# Patient Record
Sex: Male | Born: 1970 | Race: White | Hispanic: No | Marital: Married | Smoking: Never smoker
Health system: Southern US, Community
[De-identification: ages and names within clinical notes are randomized; demographics above are authoritative.]

## PROBLEM LIST (undated history)

## (undated) DIAGNOSIS — J45909 Unspecified asthma, uncomplicated: Secondary | ICD-10-CM

## (undated) DIAGNOSIS — K859 Acute pancreatitis without necrosis or infection, unspecified: Secondary | ICD-10-CM

---

## 2004-12-14 ENCOUNTER — Inpatient Hospital Stay: Payer: Self-pay | Admitting: Internal Medicine

## 2006-04-29 ENCOUNTER — Emergency Department: Payer: Self-pay | Admitting: Emergency Medicine

## 2006-06-01 ENCOUNTER — Emergency Department: Payer: Self-pay | Admitting: Unknown Physician Specialty

## 2006-06-19 ENCOUNTER — Emergency Department: Payer: Self-pay | Admitting: Emergency Medicine

## 2008-03-11 ENCOUNTER — Emergency Department: Payer: Self-pay | Admitting: Emergency Medicine

## 2008-03-22 ENCOUNTER — Ambulatory Visit: Payer: Self-pay | Admitting: Unknown Physician Specialty

## 2008-03-30 ENCOUNTER — Ambulatory Visit: Payer: Self-pay | Admitting: Orthopedic Surgery

## 2008-07-18 ENCOUNTER — Ambulatory Visit: Payer: Self-pay | Admitting: Orthopedic Surgery

## 2010-09-05 ENCOUNTER — Emergency Department: Payer: Self-pay | Admitting: Unknown Physician Specialty

## 2011-11-09 ENCOUNTER — Ambulatory Visit: Payer: Self-pay | Admitting: Physician Assistant

## 2013-01-17 ENCOUNTER — Ambulatory Visit: Payer: Self-pay | Admitting: Physician Assistant

## 2014-03-07 ENCOUNTER — Ambulatory Visit: Payer: Self-pay | Admitting: Family Medicine

## 2014-03-07 ENCOUNTER — Ambulatory Visit: Payer: Self-pay | Admitting: Orthopedic Surgery

## 2014-03-07 LAB — COMPREHENSIVE METABOLIC PANEL
ALBUMIN: 5.1 g/dL — AB (ref 3.4–5.0)
ALT: 89 U/L — AB (ref 14–63)
Alkaline Phosphatase: 80 U/L (ref 46–116)
Anion Gap: 20 — ABNORMAL HIGH (ref 7–16)
BILIRUBIN TOTAL: 1 mg/dL (ref 0.2–1.0)
BUN: 14 mg/dL (ref 7–18)
CHLORIDE: 101 mmol/L (ref 98–107)
CREATININE: 1.22 mg/dL (ref 0.60–1.30)
Calcium, Total: 10 mg/dL (ref 8.5–10.1)
Co2: 20 mmol/L — ABNORMAL LOW (ref 21–32)
EGFR (African American): >60
EGFR (Non-African Amer.): >60
GLUCOSE: 165 mg/dL — AB (ref 65–99)
Osmolality: 285 (ref 275–301)
POTASSIUM: 4.2 mmol/L (ref 3.5–5.1)
SGOT(AST): 100 U/L — ABNORMAL HIGH (ref 15–37)
Sodium: 141 mmol/L (ref 136–145)
TOTAL PROTEIN: 8.7 g/dL — AB (ref 6.4–8.2)

## 2014-03-07 LAB — CBC WITH DIFFERENTIAL/PLATELET
BASOS ABS: 0.2 10*3/uL — AB (ref 0.0–0.1)
Basophil %: 1.5 %
EOS ABS: 0 10*3/uL (ref 0.0–0.7)
Eosinophil %: 0 %
HCT: 49.1 % (ref 40.0–52.0)
HGB: 16.7 g/dL (ref 13.0–18.0)
LYMPHS ABS: 1.1 10*3/uL (ref 1.0–3.6)
Lymphocyte %: 7.5 %
MCH: 31.8 pg (ref 26.0–34.0)
MCHC: 34.1 g/dL (ref 32.0–36.0)
MCV: 93 fL (ref 80–100)
MONO ABS: 0.8 x10 3/mm (ref 0.2–1.0)
Monocyte %: 5.4 %
NEUTROS PCT: 85.6 %
Neutrophil #: 12.4 10*3/uL — ABNORMAL HIGH (ref 1.4–6.5)
Platelet: 301 10*3/uL (ref 150–440)
RBC: 5.26 10*6/uL (ref 4.40–5.90)
RDW: 13.6 % (ref 11.5–14.5)
WBC: 14.4 10*3/uL — ABNORMAL HIGH (ref 3.8–10.6)

## 2014-03-07 LAB — LIPASE, BLOOD: LIPASE: 106 U/L (ref 73–393)

## 2015-10-19 ENCOUNTER — Ambulatory Visit (INDEPENDENT_AMBULATORY_CARE_PROVIDER_SITE_OTHER)
Admission: EM | Admit: 2015-10-19 | Discharge: 2015-10-19 | Disposition: A | Discharge status: Home / Self Care | Payer: Managed Care, Other (non HMO) | Source: Home / Self Care | Attending: Family Medicine | Admitting: Family Medicine

## 2015-10-19 ENCOUNTER — Encounter: Payer: Self-pay | Admitting: Emergency Medicine

## 2015-10-19 ENCOUNTER — Emergency Department
Admission: EM | Admit: 2015-10-19 | Discharge: 2015-10-19 | Disposition: A | Discharge status: Home / Self Care | Payer: Managed Care, Other (non HMO) | Attending: Emergency Medicine | Admitting: Emergency Medicine

## 2015-10-19 DIAGNOSIS — R1084 Generalized abdominal pain: Secondary | ICD-10-CM

## 2015-10-19 DIAGNOSIS — R112 Nausea with vomiting, unspecified: Secondary | ICD-10-CM

## 2015-10-19 DIAGNOSIS — F10930 Alcohol use, unspecified with withdrawal, uncomplicated: Secondary | ICD-10-CM

## 2015-10-19 DIAGNOSIS — F10230 Alcohol dependence with withdrawal, uncomplicated: Secondary | ICD-10-CM

## 2015-10-19 LAB — COMPREHENSIVE METABOLIC PANEL
ALBUMIN: 5.2 g/dL — AB (ref 3.5–5.0)
ALK PHOS: 61 U/L (ref 38–126)
ALT: 27 U/L (ref 17–63)
ALT: 27 U/L (ref 17–63)
AST: 31 U/L (ref 15–41)
AST: 35 U/L (ref 15–41)
Albumin: 4.9 g/dL (ref 3.5–5.0)
Alkaline Phosphatase: 68 U/L (ref 38–126)
Anion gap: 13 (ref 5–15)
Anion gap: 12 (ref 5–15)
BILIRUBIN TOTAL: 1.7 mg/dL — AB (ref 0.3–1.2)
BUN: 13 mg/dL (ref 6–20)
BUN: 13 mg/dL (ref 6–20)
CALCIUM: 9.7 mg/dL (ref 8.9–10.3)
CHLORIDE: 96 mmol/L — AB (ref 101–111)
CO2: 24 mmol/L (ref 22–32)
CO2: 27 mmol/L (ref 22–32)
CREATININE: 1.03 mg/dL (ref 0.61–1.24)
Calcium: 9.9 mg/dL (ref 8.9–10.3)
Chloride: 97 mmol/L — ABNORMAL LOW (ref 101–111)
Creatinine, Ser: 0.96 mg/dL (ref 0.61–1.24)
GFR calc Af Amer: >60 mL/min (ref >60)
GFR calc Af Amer: >60 mL/min (ref >60)
GFR calc non Af Amer: >60 mL/min (ref >60)
GLUCOSE: 118 mg/dL — AB (ref 65–99)
Glucose, Bld: 112 mg/dL — ABNORMAL HIGH (ref 65–99)
POTASSIUM: 3.6 mmol/L (ref 3.5–5.1)
POTASSIUM: 3.6 mmol/L (ref 3.5–5.1)
SODIUM: 133 mmol/L — AB (ref 135–145)
Sodium: 136 mmol/L (ref 135–145)
TOTAL PROTEIN: 8.4 g/dL — AB (ref 6.5–8.1)
Total Bilirubin: 2 mg/dL — ABNORMAL HIGH (ref 0.3–1.2)
Total Protein: 9 g/dL — ABNORMAL HIGH (ref 6.5–8.1)

## 2015-10-19 LAB — CBC WITH DIFFERENTIAL/PLATELET
BASOS ABS: 0 10*3/uL (ref 0–0.1)
BASOS PCT: 0 %
EOS PCT: 0 %
Eosinophils Absolute: 0 10*3/uL (ref 0–0.7)
HCT: 49.2 % (ref 40.0–52.0)
Hemoglobin: 16.9 g/dL (ref 13.0–18.0)
Lymphocytes Relative: 13 %
Lymphs Abs: 1 10*3/uL (ref 1.0–3.6)
MCH: 31.8 pg (ref 26.0–34.0)
MCHC: 34.4 g/dL (ref 32.0–36.0)
MCV: 92.2 fL (ref 80.0–100.0)
MONO ABS: 0.7 10*3/uL (ref 0.2–1.0)
Monocytes Relative: 10 %
NEUTROS ABS: 5.6 10*3/uL (ref 1.4–6.5)
Neutrophils Relative %: 77 %
PLATELETS: 301 10*3/uL (ref 150–440)
RBC: 5.34 MIL/uL (ref 4.40–5.90)
RDW: 14.2 % (ref 11.5–14.5)
WBC: 7.3 10*3/uL (ref 3.8–10.6)

## 2015-10-19 LAB — CBC
HEMATOCRIT: 49.1 % (ref 40.0–52.0)
Hemoglobin: 16.9 g/dL (ref 13.0–18.0)
MCH: 32.1 pg (ref 26.0–34.0)
MCHC: 34.4 g/dL (ref 32.0–36.0)
MCV: 93.3 fL (ref 80.0–100.0)
Platelets: 281 10*3/uL (ref 150–440)
RBC: 5.26 MIL/uL (ref 4.40–5.90)
RDW: 14.2 % (ref 11.5–14.5)
WBC: 7.1 10*3/uL (ref 3.8–10.6)

## 2015-10-19 LAB — LIPASE, BLOOD
LIPASE: 38 U/L (ref 11–51)
Lipase: 26 U/L (ref 11–51)

## 2015-10-19 LAB — ETHANOL: Alcohol, Ethyl (B): <5 mg/dL (ref <5)

## 2015-10-19 MED ORDER — SODIUM CHLORIDE 0.9 % IV SOLN
1000.0000 mL | Freq: Once | INTRAVENOUS | Status: AC
  Administered 2015-10-19: 1000 mL via INTRAVENOUS

## 2015-10-19 MED ORDER — ONDANSETRON HCL 4 MG/2ML IJ SOLN
4.0000 mg | Freq: Once | INTRAMUSCULAR | Status: AC
  Administered 2015-10-19: 4 mg via INTRAVENOUS
  Filled 2015-10-19: qty 2

## 2015-10-19 MED ORDER — LORAZEPAM 2 MG/ML IJ SOLN
1.0000 mg | Freq: Once | INTRAMUSCULAR | Status: AC
  Administered 2015-10-19: 1 mg via INTRAVENOUS
  Filled 2015-10-19: qty 1

## 2015-10-19 MED ORDER — LORAZEPAM 0.5 MG PO TABS: 0.5000 mg | ORAL_TABLET | Freq: Three times a day (TID) | ORAL | 0 refills | Status: AC | PRN

## 2015-10-19 MED ORDER — ONDANSETRON 8 MG PO TBDP
8.0000 mg | ORAL_TABLET | Freq: Once | ORAL | Status: AC
  Administered 2015-10-19: 8 mg via ORAL

## 2015-10-19 MED ORDER — ONDANSETRON HCL 4 MG PO TABS: 4.0000 mg | ORAL_TABLET | Freq: Every day | ORAL | 1 refills | Status: AC | PRN

## 2015-10-19 NOTE — ED Provider Notes (Signed)
[facility]    CSN: [id number] Arrival date & time: 10/19/15  0806  First Provider Contact:  None       History   Chief Complaint Chief Complaint  Patient presents with  . Abdominal Pain    HPI Edgar Hatfield is a 45 y.o. male.   The history is provided by the patient.  Patient is in the process of detoxing from alcohol. He says that he has had 8-10 shots of liquor a day and he has decided to stop. He is trying to find and outpatient detox program, he had his last liquor drink on Wednesday and since yesterday he hasn't been able to keep anything down. Whenever he drinks water he just throws it back up. He has the shakes and hot/cold sweats.  (as above history by RN confirmed with patient)  Patient also states he has a prior history of admission for alcohol rehabilitation and also prior history of pancreatitis about 3 years ago. Denies any chest pains, shortness of breath, hematemesis, melena, hematochezia.    History reviewed. No pertinent past medical history.  There are no active problems to display for this patient.   History reviewed. No pertinent surgical history.     Home Medications    Prior to Admission medications   Medication Sig Start Date End Date Taking? Authorizing Provider  losartan (COZAAR) 25 MG tablet Take 25 mg by mouth daily.   Yes Historical Provider, MD  Omeprazole (PRILOSEC PO) Take by mouth.   Yes Historical Provider, MD  sertraline (ZOLOFT) 25 MG tablet Take 25 mg by mouth daily.   Yes Historical Provider, MD    Family History History reviewed. No pertinent family history.  Social History Social History  Substance Use Topics  . Smoking status: Never Smoker  . Smokeless tobacco: Never Used  . Alcohol use Yes     Allergies   Review of patient's allergies indicates no known allergies.   Review of Systems Review of Systems   Physical Exam Triage Vital Signs ED Triage Vitals  Enc Vitals Group     BP 10/19/15  0831 (!) 158/84     Pulse Rate 10/19/15 0831 76     Resp 10/19/15 0831 18     Temp 10/19/15 0831 98.2 F (36.8 C)     Temp Source 10/19/15 0831 Oral     SpO2 10/19/15 0831 97 %     Weight 10/19/15 0827 165 lb (74.8 kg)     Height 10/19/15 0827 5\' 9"  (1.753 m)     Head Circumference --      Peak Flow --      Pain Score 10/19/15 0829 5     Pain Loc --      Pain Edu? --      Excl. in GC? --    No data found.   Updated Vital Signs BP (!) 158/84 (BP Location: Left Arm)   Pulse 76   Temp 98.2 F (36.8 C) (Oral)   Resp 18   Ht 5\' 9"  (1.753 m)   Wt 165 lb (74.8 kg)   SpO2 97%   BMI 24.37 kg/m   Visual Acuity Right Eye Distance:   Left Eye Distance:   Bilateral Distance:    Right Eye Near:   Left Eye Near:    Bilateral Near:     Physical Exam  Constitutional: He appears well-developed and well-nourished. No distress.  HENT:  Head: Normocephalic and atraumatic.  Eyes: No scleral icterus.  Neck:  Neck supple.  Cardiovascular: Normal rate, regular rhythm, normal heart sounds and intact distal pulses.   Pulmonary/Chest: Effort normal and breath sounds normal. No respiratory distress. He has no wheezes. He has no rales.  Abdominal: Soft. Bowel sounds are normal. He exhibits no distension and no mass. There is tenderness (mild-moderate diffuse tenderness to palpation; no rebound or guarding). There is no rebound and no guarding. No hernia.  Skin: He is not diaphoretic.  Nursing note and vitals reviewed.    UC Treatments / Results  Labs (all labs ordered are listed, but only abnormal results are displayed) Labs Reviewed  COMPREHENSIVE METABOLIC PANEL - Abnormal; Notable for the following:       Result Value   Sodium 133 (*)    Chloride 96 (*)    Glucose, Bld 118 (*)    Total Protein 9.0 (*)    Albumin 5.2 (*)    Total Bilirubin 2.0 (*)    All other components within normal limits  CBC WITH DIFFERENTIAL/PLATELET  LIPASE, BLOOD    EKG  EKG Interpretation None         Radiology No results found.  Procedures Procedures (including critical care time)  Medications Ordered in UC Medications  ondansetron (ZOFRAN-ODT) disintegrating tablet 8 mg (8 mg Oral Given 10/19/15 0858)     Initial Impression / Assessment and Plan / UC Course  I have reviewed the triage vital signs and the nursing notes.  Pertinent labs & imaging results that were available during my care of the patient were reviewed by me and considered in my medical decision making (see chart for details).  Clinical Course      Final Clinical Impressions(s) / UC Diagnoses   Final diagnoses:  Generalized abdominal pain  Alcohol withdrawal, uncomplicated (HCC)  Nausea and vomiting, vomiting of unspecified type    New Prescriptions New Prescriptions   No medications on file    1. diagnosis and risks/potential complications reviewed with patient; due to patient's history and current symptoms recommend patient go to ED for further evaluation and management. Patient given zofran 8mg  odt and labs drawn.  Patient verbalizes understanding and refuses transport by EMS. States his father will drive him to the ED.    Payton Mccallumrlando Jarmar Rousseau, MD 10/19/15 [phone]

## 2015-10-19 NOTE — BH Assessment (Signed)
Assessment Note  Edgar Hatfield is an 45 y.o. male who presents to the ER after being seen at [facility] because he wasn't feeling well. Patient is currently detoxing form alcohol. He had his last drink this past Wednesday (10/17/2015). Prior to that he was drinking approximately 8 shots of liquor a day. Current withdrawal symptoms are; shakes, some nausea and vomiting. He admits to past cannabis use as well. Last time was approximately two months ago.  When he was 19 years, he attempted to overdose on medications. During that time he dropped out of college and his roommate introduced him to cocaine.  This took place when [place] living in [place]. Other inpatient treatment was in 2013. It was for substance abuse treatment.  Patient is denying SI/HI and AV/H. He denies current involvement with the legal system. He states he have his wife and family for support.  Discussed patient with ER MD (Dr. Cyril LoosenKinner) and patient is able to discharge home when medically cleared. Patient was giving referral information and instructions on how to follow up with Outpatient Treatment [facility] and [facility]. He stated his PCP had referred him to a therapist but haven't set it up yet.  Writer also advised the patient to call the toll free phone on his insurance card for the counselors and agencies in his network.  [facility] ? [address],  [place], [place] [address] Phone: [phone]  Diagnosis: Alcohol Use Disorder, Severe  Past Medical History: History reviewed. No pertinent past medical history.  History reviewed. No pertinent surgical history.  Family History: No family history on file.  Social History:  reports that he has never smoked. He has never used smokeless tobacco. He reports that he drinks alcohol. He reports that he does not use drugs.  Additional Social History:  Alcohol / Drug Use Pain Medications: See  PTA Prescriptions: See PTA Over the Counter: See PTA History of alcohol / drug use?: Yes Longest period of sobriety (when/how long): "A year" Negative Consequences of Use: Legal, Personal relationships, Financial Withdrawal Symptoms: Cramps, Sweats, Fever / Chills, Nausea / Vomiting Substance #1 Name of Substance 1: Alcohol 1 - Age of First Use: 13 1 - Amount (size/oz): "8 to 10 shots of 100 proof" 1 - Frequency: Daily 1 - Duration: "Skipping a day or two? About two to three years." 1 - Last Use / Amount: 10/17/2015  CIWA: CIWA-Ar BP: (!) 177/110 Pulse Rate: 68 Nausea and Vomiting: mild nausea with no vomiting Tactile Disturbances: none Tremor: moderate, with patient's arms extended Auditory Disturbances: not present Paroxysmal Sweats: no sweat visible Visual Disturbances: not present Anxiety: two Headache, Fullness in Head: very mild Agitation: normal activity Orientation and Clouding of Sensorium: oriented and can do serial additions CIWA-Ar Total: 8 COWS:    Allergies: No Known Allergies  Home Medications:  (Not in a hospital admission)  OB/GYN Status:  No LMP for male patient.  General Assessment Data Location of Assessment: [facility] ED TTS Assessment: In system Is this a Tele or Face-to-Face Assessment?: Face-to-Face Is this an Initial Assessment or a Re-assessment for this encounter?: Initial Assessment Marital status: [place]ried [place] n[place] Is patient pregnant?: No Pregnancy Status: No Living Arrangements: Spouse/significant other, Children Can pt return to current living arrangement?: Yes Admission Status: Voluntary Is patient capable of signing voluntary admission?: Yes Referral Source: Self/Family/Friend Insurance type: Designer, industrial[employer]  Medical Screening Exam [facility] Walk-in ONLY) Medical Exam completed:  Yes  Crisis Care Plan Living Arrangements: Spouse/significant other, Children Legal Guardian: Other: (Reports of none) Name of Psychiatrist: Reports of  None Name of Therapist: Reports of None  Education Status Is patient currently in school?: No Current Grade: n/a Highest grade of school patient has completed: Some college Name of school: n/a Contact person: n/a  Risk to self with the past 6 months Suicidal Ideation: No Has patient been a risk to self within the past 6 months prior to admission? : No Suicidal Intent: No Has patient had any suicidal intent within the past 6 months prior to admission? : No Is patient at risk for suicide?: No Suicidal Plan?: No Has patient had any suicidal plan within the past 6 months prior to admission? : No Access to Means: No What has been your use of drugs/alcohol within the last 12 months?: Alcohol Previous Attempts/Gestures: Yes How many times?: 1 Other Self Harm Risks: Active addiction Triggers for Past Attempts: Other (Comment), Other personal contacts Intentional Self Injurious Behavior: None Family Suicide History: No Recent stressful life event(s): Other (Comment) (Reports of none) Persecutory voices/beliefs?: No Depression: Yes Depression Symptoms: Tearfulness, Isolating, Guilt, Feeling worthless/self pity, Feeling angry/irritable, Loss of interest in usual pleasures Substance abuse history and/or treatment for substance abuse?: Yes Suicide prevention information given to non-admitted patients: Not applicable  Risk to Others within the past 6 months Homicidal Ideation: No Does patient have any lifetime risk of violence toward others beyond the six months prior to admission? : No Thoughts of Harm to Others: No Current Homicidal Intent: No Current Homicidal Plan: No Access to Homicidal Means: No Identified Victim: Reports of none History of harm to others?: No Assessment of Violence: None Noted Violent Behavior Description: Reports of none Does patient have access to weapons?: No Criminal Charges Pending?: No Does patient have a court date: No Is patient on probation?:  No  Psychosis Hallucinations: None noted Delusions: None noted  Mental Status Report Appearance/Hygiene: In hospital gown Eye Contact: Good Motor Activity: Freedom of movement, Unremarkable Speech: Logical/coherent, Unremarkable Level of Consciousness: Alert Mood: Anxious, Depressed, Sad, Pleasant Affect: Appropriate to circumstance, Anxious, Sad Anxiety Level: Minimal Thought Processes: Coherent, Relevant Judgement: Unimpaired Orientation: Place, Person, Time, Situation, Appropriate for developmental age Obsessive Compulsive Thoughts/Behaviors: None  Cognitive Functioning Concentration: Normal Memory: Recent Intact, Remote Intact IQ: Average Insight: Fair Impulse Control: Fair Appetite: Fair Weight Loss: 4 Weight Gain: 0 Sleep: No Change Total Hours of Sleep: 8 Vegetative Symptoms: None  ADLScreening [facility] Assessment Services) Patient's cognitive ability adequate to safely complete daily activities?: Yes Patient able to express need for assistance with ADLs?: Yes Independently performs ADLs?: Yes (appropriate for developmental age)  Prior Inpatient Therapy Prior Inpatient Therapy: Yes Prior Therapy Dates: 2006 & 1989 Prior Therapy Facilty/Provider(s): [facility] [place] [facility] [place] Reason for Treatment: Depression and Substance Use  Prior Outpatient Therapy Prior Outpatient Therapy: Yes Prior Therapy Dates: 2013 Prior Therapy Facilty/Provider(s): [facility] Reason for Treatment: SA Treatment Does patient have an ACCT team?: No Does patient have Intensive In-House Services?  : No Does patient have [facility] services? : No Does patient have P4CC services?: No  ADL Screening (condition at time of admission) Patient's cognitive ability adequate to safely complete daily activities?: Yes Is the patient deaf or have difficulty hearing?: No Does the patient have difficulty seeing, even when wearing glasses/contacts?: No Does the patient have  difficulty concentrating, remembering, or making decisions?: No Patient able to express need for assistance with ADLs?: Yes  Does the patient have difficulty dressing or bathing?: No Independently performs ADLs?: Yes (appropriate for developmental age) Does the patient have difficulty walking or climbing stairs?: No Weakness of Legs: None Weakness of Arms/Hands: None  Home Assistive Devices/Equipment Home Assistive Devices/Equipment: None  Therapy Consults (therapy consults require a physician order) PT Evaluation Needed: No OT Evalulation Needed: No SLP Evaluation Needed: No Abuse/Neglect Assessment (Assessment to be complete while patient is alone) Physical Abuse: Denies Verbal Abuse: Denies Sexual Abuse: Denies Exploitation of patient/patient's resources: Denies Values / Beliefs Cultural Requests During Hospitalization: None Spiritual Requests During Hospitalization: None Consults Spiritual Care Consult Needed: No Social Work Consult Needed: No Merchant navy officer (For Healthcare) Does patient have an advance directive?: No    Additional Information 1:1 In Past 12 Months?: No CIRT Risk: No Elopement Risk: No Does patient have medical clearance?: Yes  Child/Adolescent Assessment Running Away Risk: Denies (Patient is an adult)  Disposition:     On Site Evaluation by:   Reviewed with Physician:    Lilyan Gilford MS, LCAS, LPC, NCC, CCSI Therapeutic Triage Specialist 10/19/2015 12:12 PM

## 2015-10-19 NOTE — ED Triage Notes (Signed)
Patient is in the process of detoxing from alcohol. He says that he has had 8-10 shots of liquor a day and he has decided to stop. He is trying to find and outpatient detox program, he had his last liquor drink on Wednesday and since yesterday he hasn't been able to keep anything down. Whenever he drinks water he just throws it back up. He has the shakes and hot/cold sweats.

## 2015-10-19 NOTE — ED Provider Notes (Signed)
[facility] Emergency Department Provider Note   ____________________________________________    I have reviewed the triage vital signs and the nursing notes.   HISTORY  Chief Complaint Alcohol withdrawal    HPI Edgar Hatfield is a 45 y.o. male who presents with complaints of alcohol withdrawal. Patient reports he has a history of chronic alcohol abuse. He stopped "cold [place]" 2 days ago. He has had nausea and vomiting today and feels jittery. No history of seizures. Denies other substances.he requests assistance with detox preferably outpatient and at night   History reviewed. No pertinent past medical history.  There are no active problems to display for this patient.   History reviewed. No pertinent surgical history.  Prior to Admission medications   Medication Sig Start Date End Date Taking? Authorizing Provider  LORazepam (ATIVAN) 0.5 MG tablet Take 1 tablet (0.5 mg total) by mouth every 8 (eight) hours as needed for anxiety. 10/19/15 10/26/15  Jene Every, MD  losartan (COZAAR) 25 MG tablet Take 25 mg by mouth daily.    Historical Provider, MD  Omeprazole (PRILOSEC PO) Take by mouth.    Historical Provider, MD  ondansetron (ZOFRAN) 4 MG tablet Take 1 tablet (4 mg total) by mouth daily as needed for nausea or vomiting. 10/19/15   Jene Every, MD  sertraline (ZOLOFT) 25 MG tablet Take 25 mg by mouth daily.    Historical Provider, MD     Allergies Review of patient's allergies indicates no known allergies.  No family history on file.  Social History Social History  Substance Use Topics  . Smoking status: Never Smoker  . Smokeless tobacco: Never Used  . Alcohol use Yes    Review of Systems  Constitutional: No fever/chills Eyes: No visual changes.   Cardiovascular: Denies chest pain. Respiratory: Denies shortness of breath. Gastrointestinal: As above Genitourinary: Negative for dysuria. Musculoskeletal: Negative for back  pain. Skin: Unremarkable Neurological: Negative for headaches or weakness  10-point ROS otherwise negative.  ____________________________________________   PHYSICAL EXAM:  VITAL SIGNS: ED Triage Vitals  Enc Vitals Group     BP 10/19/15 1010 (!) 177/110     Pulse Rate 10/19/15 1010 68     Resp 10/19/15 1010 20     Temp 10/19/15 1010 98.2 F (36.8 C)     Temp Source 10/19/15 1010 Oral     SpO2 10/19/15 1010 98 %     Weight 10/19/15 1011 165 lb (74.8 kg)     Height 10/19/15 1011 5\' 9"  (1.753 m)     Head Circumference --      Peak Flow --      Pain Score 10/19/15 1011 5     Pain Loc --      Pain Edu? --      Excl. in GC? --     Constitutional: Alert and oriented. No acute distress. Pleasant and interactive Eyes: Conjunctivae are normal.  Head: Atraumatic. Nose: No congestion/rhinnorhea. Mouth/Throat: Mucous membranes are moist.   Neck:  Painless ROM Cardiovascular: Normal rate, regular rhythm. Grossly normal heart sounds.  Good peripheral circulation. Respiratory: Normal respiratory effort.  No retractions. Lungs CTAB. Gastrointestinal: Soft and nontender. No distention.  No CVA tenderness. Genitourinary: deferred Musculoskeletal: No lower extremity tenderness nor edema.  Warm and well perfused Neurologic:  Normal speech and language. No gross focal neurologic deficits are appreciated.  Skin:  Skin is warm, dry and intact. No rash noted. Psychiatric: Mood and affect are normal. Speech and behavior are normal.  ____________________________________________   LABS (all labs ordered are listed, but only abnormal results are displayed)  Labs Reviewed  COMPREHENSIVE METABOLIC PANEL - Abnormal; Notable for the following:       Result Value   Chloride 97 (*)    Glucose, Bld 112 (*)    Total Protein 8.4 (*)    Total Bilirubin 1.7 (*)    All other components within normal limits  CBC  LIPASE, BLOOD  ETHANOL  URINE DRUG SCREEN, QUALITATIVE ([facility] ONLY)    ____________________________________________  EKG  None ____________________________________________  RADIOLOGY  None ____________________________________________   PROCEDURES  Procedure(s) performed: No    Critical Care performed: No ____________________________________________   INITIAL IMPRESSION / ASSESSMENT AND PLAN / ED COURSE  Pertinent labs & imaging results that were available during my care of the patient were reviewed by me and considered in my medical decision making (see chart for details).  Patient overall well-appearing no distress. We will treat his nausea with IV Zofran, give Ativan for jitteriness. No elevation of heart rate. Discussed with behavioral medicine team and they will help him with outpatient resources. Clinical Course  Patient feels better after treatment. Appropriate for discharge at this time. We did discuss return precautions. ____________________________________________   FINAL CLINICAL IMPRESSION(S) / ED DIAGNOSES  Final diagnoses:  Non-intractable vomiting with nausea, vomiting of unspecified type      NEW MEDICATIONS STARTED DURING THIS VISIT:  Discharge Medication List as of 10/19/2015 12:43 PM    START taking these medications   Details  LORazepam (ATIVAN) 0.5 MG tablet Take 1 tablet (0.5 mg total) by mouth every 8 (eight) hours as needed for anxiety., Starting Fri 10/19/2015, Until Fri 10/26/2015, Print    ondansetron (ZOFRAN) 4 MG tablet Take 1 tablet (4 mg total) by mouth daily as needed for nausea or vomiting., Starting Fri 10/19/2015, Print         Note:  This document was prepared using Dragon voice recognition software and may include unintentional dictation errors.    Jene Everyobert Renaud Celli, MD 10/19/15 1534

## 2015-10-19 NOTE — ED Triage Notes (Signed)
[facility]  States he is detoxing  From [facility]  Having some abd cramping     Positive n/v   Last time vomited was 7 am

## 2016-05-08 ENCOUNTER — Emergency Department
Admission: EM | Admit: 2016-05-08 | Discharge: 2016-05-08 | Disposition: A | Discharge status: Home / Self Care | Payer: PRIVATE HEALTH INSURANCE | Attending: Emergency Medicine | Admitting: Emergency Medicine

## 2016-05-08 ENCOUNTER — Encounter: Payer: Self-pay | Admitting: Emergency Medicine

## 2016-05-08 ENCOUNTER — Emergency Department: Payer: PRIVATE HEALTH INSURANCE

## 2016-05-08 DIAGNOSIS — R112 Nausea with vomiting, unspecified: Secondary | ICD-10-CM | POA: Diagnosis not present

## 2016-05-08 DIAGNOSIS — R1084 Generalized abdominal pain: Secondary | ICD-10-CM | POA: Diagnosis not present

## 2016-05-08 DIAGNOSIS — J45909 Unspecified asthma, uncomplicated: Secondary | ICD-10-CM | POA: Diagnosis not present

## 2016-05-08 DIAGNOSIS — R1013 Epigastric pain: Secondary | ICD-10-CM | POA: Diagnosis present

## 2016-05-08 DIAGNOSIS — F10229 Alcohol dependence with intoxication, unspecified: Secondary | ICD-10-CM | POA: Diagnosis not present

## 2016-05-08 HISTORY — DX: Acute pancreatitis without necrosis or infection, unspecified: K85.90

## 2016-05-08 HISTORY — DX: Unspecified asthma, uncomplicated: J45.909

## 2016-05-08 LAB — CBC
HEMATOCRIT: 47.2 % (ref 40.0–52.0)
HEMOGLOBIN: 16.6 g/dL (ref 13.0–18.0)
MCH: 31.4 pg (ref 26.0–34.0)
MCHC: 35.2 g/dL (ref 32.0–36.0)
MCV: 89.2 fL (ref 80.0–100.0)
Platelets: 334 10*3/uL (ref 150–440)
RBC: 5.29 MIL/uL (ref 4.40–5.90)
RDW: 13.8 % (ref 11.5–14.5)
WBC: 9.2 10*3/uL (ref 3.8–10.6)

## 2016-05-08 LAB — COMPREHENSIVE METABOLIC PANEL
ALT: 57 U/L (ref 17–63)
ANION GAP: 16 — AB (ref 5–15)
AST: 98 U/L — ABNORMAL HIGH (ref 15–41)
Albumin: 4.6 g/dL (ref 3.5–5.0)
Alkaline Phosphatase: 51 U/L (ref 38–126)
BUN: 10 mg/dL (ref 6–20)
CO2: 20 mmol/L — AB (ref 22–32)
Calcium: 9.3 mg/dL (ref 8.9–10.3)
Chloride: 104 mmol/L (ref 101–111)
Creatinine, Ser: 0.92 mg/dL (ref 0.61–1.24)
GFR calc non Af Amer: >60 mL/min (ref >60)
GLUCOSE: 148 mg/dL — AB (ref 65–99)
POTASSIUM: 3.4 mmol/L — AB (ref 3.5–5.1)
SODIUM: 140 mmol/L (ref 135–145)
Total Bilirubin: 1.1 mg/dL (ref 0.3–1.2)
Total Protein: 7.8 g/dL (ref 6.5–8.1)

## 2016-05-08 LAB — URINALYSIS, COMPLETE (UACMP) WITH MICROSCOPIC
BACTERIA UA: NONE SEEN
BILIRUBIN URINE: NEGATIVE
GLUCOSE, UA: NEGATIVE mg/dL
HGB URINE DIPSTICK: NEGATIVE
KETONES UR: 20 mg/dL — AB
Leukocytes, UA: NEGATIVE
Nitrite: NEGATIVE
Protein, ur: NEGATIVE mg/dL
RBC / HPF: NONE SEEN RBC/hpf (ref 0–5)
Specific Gravity, Urine: 1.033 — ABNORMAL HIGH (ref 1.005–1.030)
Squamous Epithelial / LPF: NONE SEEN
pH: 7 (ref 5.0–8.0)

## 2016-05-08 LAB — TROPONIN I: Troponin I: <0.03 ng/mL (ref <0.03)

## 2016-05-08 LAB — ETHANOL: ALCOHOL ETHYL (B): 75 mg/dL — AB (ref <5)

## 2016-05-08 LAB — LIPASE, BLOOD: LIPASE: 20 U/L (ref 11–51)

## 2016-05-08 MED ORDER — ONDANSETRON 4 MG PO TBDP: 4.0000 mg | ORAL_TABLET | Freq: Three times a day (TID) | ORAL | 0 refills | Status: AC | PRN

## 2016-05-08 MED ORDER — LORAZEPAM 2 MG PO TABS
0.0000 mg | ORAL_TABLET | Freq: Four times a day (QID) | ORAL | Status: DC
  Administered 2016-05-08: 1 mg via ORAL
  Filled 2016-05-08: qty 1

## 2016-05-08 MED ORDER — SODIUM CHLORIDE 0.9 % IV BOLUS (SEPSIS)
1000.0000 mL | Freq: Once | INTRAVENOUS | Status: AC
  Administered 2016-05-08: 1000 mL via INTRAVENOUS

## 2016-05-08 MED ORDER — ONDANSETRON HCL 4 MG/2ML IJ SOLN
4.0000 mg | Freq: Once | INTRAMUSCULAR | Status: AC
  Administered 2016-05-08: 4 mg via INTRAVENOUS

## 2016-05-08 MED ORDER — HYDROMORPHONE HCL 1 MG/ML IJ SOLN
1.0000 mg | Freq: Once | INTRAMUSCULAR | Status: AC
  Administered 2016-05-08: 1 mg via INTRAVENOUS

## 2016-05-08 MED ORDER — LORAZEPAM 2 MG PO TABS: 0.0000 mg | ORAL_TABLET | Freq: Two times a day (BID) | ORAL | Status: DC

## 2016-05-08 MED ORDER — IOPAMIDOL (ISOVUE-300) INJECTION 61%
30.0000 mL | Freq: Once | INTRAVENOUS | Status: AC | PRN
  Administered 2016-05-08: 30 mL via ORAL

## 2016-05-08 MED ORDER — HYDROMORPHONE HCL 1 MG/ML IJ SOLN
INTRAMUSCULAR | Status: AC
  Filled 2016-05-08: qty 1

## 2016-05-08 MED ORDER — IOPAMIDOL (ISOVUE-300) INJECTION 61%
100.0000 mL | Freq: Once | INTRAVENOUS | Status: AC | PRN
  Administered 2016-05-08: 100 mL via INTRAVENOUS

## 2016-05-08 MED ORDER — MORPHINE SULFATE (PF) 4 MG/ML IV SOLN
4.0000 mg | Freq: Once | INTRAVENOUS | Status: AC
  Administered 2016-05-08: 4 mg via INTRAVENOUS
  Filled 2016-05-08: qty 1

## 2016-05-08 MED ORDER — ONDANSETRON HCL 4 MG/2ML IJ SOLN
INTRAMUSCULAR | Status: AC
  Administered 2016-05-08: 4 mg via INTRAVENOUS
  Filled 2016-05-08: qty 2

## 2016-05-08 NOTE — ED Notes (Signed)
Pt given breakfast tray

## 2016-05-08 NOTE — Discharge Instructions (Signed)
Please follow the instructions for outpatient detox for alcohol dependence. Do not stop cold [place] without the help of the physician, as this can lead to life-threatening seizures.  Return to the emergency department if you develop severe pain, inability to keep down fluids, fever, or any other symptoms concerning to you.

## 2016-05-08 NOTE — ED Notes (Signed)
Patient transported to CT 

## 2016-05-08 NOTE — ED Provider Notes (Signed)
This patient was signed out to me by Dr. Bayard Malesandolph Brown. He is a 46 year old gentleman with alcohol dependence who presented for abdominal pain with nausea and vomiting. His abdominal workup was reassuring, and felt most likely to be due to alcohol or GI illness; there was no evidence of acute surgical pathology or complication. He was medically cleared prior to my arrival and was awaiting evaluation by TTS for alcohol detoxification. The patient has opted for outpatient detox and has been given all the information. At this time he is eating and drinking normally, pain-free and stable for discharge.   Rockne MenghiniAnne-Caroline Derricka Mertz, MD 05/08/16 [phone]

## 2016-05-08 NOTE — BH Assessment (Signed)
Assessment Note  Edgar KillianChristopher Ashley Hatfield is an 46 y.o. male who presents to the ER due to having medical concerns, because of his alcohol use. Patient reports of drinking approximately "8 to 10 shots of liquor" on a daily basis. He states he has drank this amount and frequency for "several years." He reports, his symptoms of withdrawal are "normally, shakes, cold chills... No blacks or seizures. Nothing like that."  During the interview, the patient was calm, cooperative, pleasant and polite. He was able to answer questions appropriately. He have an upcoming court date, 06/09/2016 for a DWI charge he received 11/2015. He denies any other involvement with the legal system and none with DSS. Patient also denies history of violence and aggression. He denies SI/HI and AV/H.  Patient wishes to get information for outpatient treatment. "I just started my new job and I don't want to lose it. I rather do outpatient treatment, instead of outpatient."  Discussed patient with ER MD (Dr. Sharma CovertNorman) and patient is able to discharge home when medically cleared. Patient was giving referral information and instructions on how to follow up with Outpatient Treatment ([facility]) and [facility].  Diagnosis: Alcohol Abuse Disorder, Severe  Past Medical History:  Past Medical History:  Diagnosis Date  . Asthma   . Pancreatitis     History reviewed. No pertinent surgical history.  Family History: No family history on file.  Social History:  reports that he has never smoked. He has never used smokeless tobacco. He reports that he drinks alcohol. He reports that he does not use drugs.  Additional Social History:  Alcohol / Drug Use Pain Medications: See PTA Prescriptions: See PTA Over the Counter: See PTA History of alcohol / drug use?: Yes Longest period of sobriety (when/how long): "A year" Negative Consequences of Use: Legal, Personal relationships, Financial Withdrawal Symptoms:  Cramps, Sweats, Fever / Chills, Nausea / Vomiting Substance #1 Name of Substance 1: Alcohol 1 - Age of First Use: Unable to quantify 1 - Amount (size/oz): "8 shoots of liquor" 1 - Frequency: Daily 1 - Duration: Several years 1 - Last Use / Amount: 05/08/2016 Substance #2 Name of Substance 2: Cannabis 2 - Age of First Use: Teenager 2 - Amount (size/oz): Unable to quantify 2 - Frequency: "About one time a week" 2 - Duration: Unable to quantify 2 - Last Use / Amount: 04/2016  CIWA: CIWA-Ar BP: (!) 171/102 Pulse Rate: 73 Nausea and Vomiting: no nausea and no vomiting Tactile Disturbances: none Tremor: no tremor Auditory Disturbances: not present Paroxysmal Sweats: no sweat visible Visual Disturbances: not present Anxiety: five Headache, Fullness in Head: none present Agitation: normal activity Orientation and Clouding of Sensorium: oriented and can do serial additions CIWA-Ar Total: 5 COWS:    Allergies: No Known Allergies  Home Medications:  (Not in a hospital admission)  OB/GYN Status:  No LMP for male patient.  General Assessment Data [facility] Assessment: In system Is this a Tele or Face-to-Face Assessment?: Face-to-Face Is this an Initial Assessment or a Re-assessment for this encounter?: Initial Assessment Marital status: Single Maiden name: n/a Is patient pregnant?: No Pregnancy Status: No Living Arrangements: Spouse/significant other, Children Can pt return to current living arrangement?: Yes Admission Status: Voluntary Is patient capable of signing voluntary admission?: Yes Referral Source: Self/Family/Friend Insurance type: [employer] ("It's through temp services.")  Medical Screening Exam [facility] ONLY) Medical Exam completed: Yes  Crisis Care Plan Living Arrangements: Spouse/significant other, Children  Legal Guardian: Other: (Self) Name of Psychiatrist: Reports of none Name of Therapist: Reports of  none  Education Status Is patient currently in school?: No Current Grade: n/a Highest grade of school patient has completed: Some college Name of school: n/a Contact person: n/a  Risk to self with the past 6 months Suicidal Ideation: No-Not Currently/Within Last 6 Months Has patient been a risk to self within the past 6 months prior to admission? : No Suicidal Intent: No Has patient had any suicidal intent within the past 6 months prior to admission? : No Is patient at risk for suicide?: No Suicidal Plan?: No Has patient had any suicidal plan within the past 6 months prior to admission? : No Access to Means: No What has been your use of drugs/alcohol within the last 12 months?: Alcohol & THC Previous Attempts/Gestures: No How many times?: 0 Other Self Harm Risks: Active addiction Triggers for Past Attempts: None known Intentional Self Injurious Behavior: None Family Suicide History: Unknown Recent stressful life event(s): Other (Comment) (Active addiction) Persecutory voices/beliefs?: No Depression: Yes Depression Symptoms: Guilt, Feeling worthless/self pity Substance abuse history and/or treatment for substance abuse?: Yes Suicide prevention information given to non-admitted patients: Not applicable  Risk to Others within the past 6 months Homicidal Ideation: No Does patient have any lifetime risk of violence toward others beyond the six months prior to admission? : No Thoughts of Harm to Others: No Current Homicidal Intent: No Current Homicidal Plan: No Access to Homicidal Means: No Identified Victim: Reports of none History of harm to others?: No Assessment of Violence: None Noted Violent Behavior Description: Reports of none Does patient have access to weapons?: No Criminal Charges Pending?: Yes Describe Pending Criminal Charges: DWI Does patient have a court date: Yes Court Date: 05/29/16 Is patient on probation?: No  Psychosis Hallucinations: None  noted Delusions: None noted  Mental Status Report Appearance/Hygiene: Unremarkable, In hospital gown Eye Contact: Good Motor Activity: Other (Comment) (Patient laying in bed) Speech: Logical/coherent, Unremarkable Level of Consciousness: Alert Mood: Pleasant, Euthymic Affect: Appropriate to circumstance Anxiety Level: None Thought Processes: Coherent, Relevant Judgement: Unimpaired Orientation: Person, Place, Time, Situation, Appropriate for developmental age Obsessive Compulsive Thoughts/Behaviors: Minimal  Cognitive Functioning Concentration: Normal Memory: Recent Intact, Remote Intact IQ: Average Insight: Fair Impulse Control: Fair Appetite: Poor Weight Loss: 10 (Within the last 30 days) Weight Gain: 0 Sleep: No Change Total Hours of Sleep: 8 Vegetative Symptoms: None  ADLScreening [facility] Assessment Services) Patient's cognitive ability adequate to safely complete daily activities?: Yes Patient able to express need for assistance with ADLs?: Yes Independently performs ADLs?: Yes (appropriate for developmental age)  Prior Inpatient Therapy Prior Inpatient Therapy: Yes Prior Therapy Dates: "It's been some years ago." ( Unable to quantify) Prior Therapy Facilty/Provider(s): Patient do not remember the name Reason for Treatment: Alcohol Abuse  Prior Outpatient Therapy Prior Outpatient Therapy: No Prior Therapy Dates: Reports of none Prior Therapy Facilty/Provider(s): Reports of none Reason for Treatment: Reports of none Does patient have an ACCT team?: No Does patient have Intensive In-House Services?  : No Does patient have [facility] services? : No Does patient have P4CC services?: No  ADL Screening (condition at time of admission) Patient's cognitive ability adequate to safely complete daily activities?: Yes Is the patient deaf or have difficulty hearing?: No Does the patient have difficulty seeing, even when wearing glasses/contacts?: No Does the patient have  difficulty concentrating, remembering, or making decisions?: No Patient able to express need for assistance with ADLs?: Yes Does the  patient have difficulty dressing or bathing?: No Independently performs ADLs?: Yes (appropriate for developmental age) Does the patient have difficulty walking or climbing stairs?: No Weakness of Legs: None Weakness of Arms/Hands: None  Home Assistive Devices/Equipment Home Assistive Devices/Equipment: None  Therapy Consults (therapy consults require a physician order) PT Evaluation Needed: No OT Evalulation Needed: No SLP Evaluation Needed: No Abuse/Neglect Assessment (Assessment to be complete while patient is alone) Physical Abuse: Denies Verbal Abuse: Denies Sexual Abuse: Denies Exploitation of patient/patient's resources: Denies Self-Neglect: Denies Values / Beliefs Cultural Requests During Hospitalization: None Spiritual Requests During Hospitalization: None Consults Spiritual Care Consult Needed: No Social Work Consult Needed: No Merchant navy officer (For Healthcare) Does Patient Have a Medical Advance Directive?: No Would patient like information on creating a medical advance directive?: No - Patient declined    Additional Information 1:1 In Past 12 Months?: No CIRT Risk: No Elopement Risk: No Does patient have medical clearance?: Yes  Child/Adolescent Assessment Running Away Risk: Denies (Patient is an adult)  Disposition:  Disposition Initial Assessment Completed for this Encounter: Yes Disposition of Patient: Outpatient treatment Type of outpatient treatment: Adult (Substance Abuse Treatment)  On Site Evaluation by:   Reviewed with Physician:    Lilyan Gilford MS, LCAS, LPC, NCC, CCSI Therapeutic Triage Specialist 05/08/2016 10:28 AM

## 2016-05-08 NOTE — ED Provider Notes (Signed)
[facility] Emergency Department Provider Note    First MD Initiated Contact with Patient 05/08/16 0118     (approximate)  I have reviewed the triage vital signs and the nursing notes.   HISTORY  Chief Complaint Abdominal Pain    HPI Edgar Hatfield is a 46 y.o. male with history of alcohol abuse and pancreatitis presents to the emergency department with epigastric abdominal pain is currently 10 out of 10 associated with vomiting 3 days acute worsening of her last hour. Patient admits to alcohol ingestion today approximately 4 shots. Patient denies any fever no diarrhea no constipation. Patient denies any urinary symptoms   Past Medical History:  Diagnosis Date  . Asthma   . Pancreatitis     There are no active problems to display for this patient.  Past surgical history None  Prior to Admission medications   Medication Sig Start Date End Date Taking? Authorizing Provider  losartan (COZAAR) 25 MG tablet Take 25 mg by mouth daily.   Yes Historical Provider, MD  Omeprazole (PRILOSEC PO) Take 20 mg by mouth daily.    Yes Historical Provider, MD  sertraline (ZOLOFT) 25 MG tablet Take 25 mg by mouth daily.   Yes Historical Provider, MD  ondansetron (ZOFRAN ODT) 4 MG disintegrating tablet Take 1 tablet (4 mg total) by mouth every 8 (eight) hours as needed for nausea or vomiting. 05/08/16   Rockne Menghini, MD  ondansetron (ZOFRAN) 4 MG tablet Take 1 tablet (4 mg total) by mouth daily as needed for nausea or vomiting. Patient not taking: Reported on 05/08/2016 10/19/15   Jene Every, MD    Allergies No known drug allergies No family history on file.  Social History Social History  Substance Use Topics  . Smoking status: Never Smoker  . Smokeless tobacco: Never Used  . Alcohol use Yes    Review of Systems Constitutional: No fever/chills Eyes: No visual changes. ENT: No sore throat. Cardiovascular: Denies chest pain. Respiratory:  Denies shortness of breath. Gastrointestinal: Positive for abdominal pain and vomiting  Genitourinary: Negative for dysuria. Musculoskeletal: Negative for back pain. Skin: Negative for rash. Neurological: Negative for headaches, focal weakness or numbness.  10-point ROS otherwise negative.  ____________________________________________   PHYSICAL EXAM:  VITAL SIGNS: ED Triage Vitals  Enc Vitals Group     BP 05/08/16 0057 (!) 180/100     Pulse Rate 05/08/16 0056 67     Resp 05/08/16 0056 18     Temp 05/08/16 0056 98.2 F (36.8 C)     Temp Source 05/08/16 0056 Oral     SpO2 05/08/16 0056 99 %     Weight 05/08/16 0055 155 lb (70.3 kg)     Height 05/08/16 0055 5\' 9"  (1.753 m)     Head Circumference --      Peak Flow --      Pain Score 05/08/16 0055 8     Pain Loc --      Pain Edu? --      Excl. in GC? --     Constitutional: Alert and oriented. Apparent discomfort actively vomiting.. Eyes: Conjunctivae are normal. PERRL. EOMI. Head: Atraumatic. Mouth/Throat: Mucous membranes are moist.  Oropharynx non-erythematous. Neck: No stridor.   Cardiovascular: Normal rate, regular rhythm. Good peripheral circulation. Grossly normal heart sounds. Respiratory: Normal respiratory effort.  No retractions. Lungs CTAB. Gastrointestinal: Epigastric tenderness to palpation.. No distention.  Musculoskeletal: No lower extremity tenderness nor edema. No gross deformities of extremities. Neurologic:  Normal speech  and language. No gross focal neurologic deficits are appreciated.  Skin:  Skin is warm, dry and intact. No rash noted. Psychiatric: Mood and affect are normal. Speech and behavior are normal.  ____________________________________________   LABS (all labs ordered are listed, but only abnormal results are displayed)  Labs Reviewed  COMPREHENSIVE METABOLIC PANEL - Abnormal; Notable for the following:       Result Value   Potassium 3.4 (*)    CO2 20 (*)    Glucose, Bld 148 (*)     AST 98 (*)    Anion gap 16 (*)    All other components within normal limits  URINALYSIS, COMPLETE (UACMP) WITH MICROSCOPIC - Abnormal; Notable for the following:    Color, Urine YELLOW (*)    APPearance CLEAR (*)    Specific Gravity, Urine 1.033 (*)    Ketones, ur 20 (*)    All other components within normal limits  ETHANOL - Abnormal; Notable for the following:    Alcohol, Ethyl (B) 75 (*)    All other components within normal limits  LIPASE, BLOOD  CBC  TROPONIN I   ____________________________________________  EKG  ED ECG REPORT I, Bend N Khy Pitre, the attending physician, personally viewed and interpreted this ECG.   Date: 05/09/2016  EKG Time: 1:25 AM  Rate: 72  Rhythm: Normal sinus rhythm  Axis: Normal  Intervals: Normal  ST&T Change: None  ____________________________________________  RADIOLOGY I,  N Alexandria Shiflett, personally viewed and evaluated these images (plain radiographs) as part of my medical decision making, as well as reviewing the written report by the radiologist.  Ct Abdomen Pelvis W Contrast  Result Date: 05/08/2016 CLINICAL DATA:  46 y/o M; abdominal pain and vomiting. History of pancreatitis. EXAM: CT ABDOMEN AND PELVIS WITH CONTRAST TECHNIQUE: Multidetector CT imaging of the abdomen and pelvis was performed using the standard protocol following bolus administration of intravenous contrast. CONTRAST:  100mL ISOVUE-300 IOPAMIDOL (ISOVUE-300) INJECTION 61% COMPARISON:  03/07/2014 CT of the abdomen and pelvis FINDINGS: Lower chest: No acute abnormality. Hepatobiliary: No focal liver abnormality is seen. No gallstones, gallbladder wall thickening, or biliary dilatation. Pancreas: Unremarkable. No pancreatic ductal dilatation or surrounding inflammatory changes. Spleen: Normal in size without focal abnormality. Adrenals/Urinary Tract: Adrenal glands are unremarkable. Kidneys are normal, without renal calculi, focal lesion, or hydronephrosis. Bladder is  unremarkable. Stomach/Bowel: Stomach is within normal limits. Appendix appears normal. No evidence of bowel wall thickening, distention, or inflammatory changes. Vascular/Lymphatic: No significant vascular findings are present. No enlarged abdominal or pelvic lymph nodes. Reproductive: Moderate prostate enlargement with central calcifications. Other: No abdominal wall hernia or abnormality. No abdominopelvic ascites. Musculoskeletal: No acute or significant osseous findings. 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 IMPRESSION: 1. No acute process as explanation for abdominal pain identified. 2. 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 3. Otherwise unremarkable CT of abdomen and pelvis. Electronically Signed   By: Mitzi HansenLance  Furusawa-Stratton M.D.   On: 05/08/2016 05:53      Procedures   ____________________________________________   INITIAL IMPRESSION / ASSESSMENT AND PLAN / ED COURSE  Pertinent labs & imaging results that were available during my care of the patient were reviewed by me and considered in my medical decision making (see chart for details).  Patient given IV morphine 4 mg Zofran 4 mg with improvement of pain and vomiting. Patient is requesting alcohol detox at this time.      ____________________________________________  FINAL CLINICAL IMPRESSION(S) / ED DIAGNOSES  Final  diagnoses:  Generalized abdominal pain  Non-intractable vomiting with nausea, unspecified vomiting type  Alcohol dependence with intoxication with complication [facility])     MEDICATIONS GIVEN DURING THIS VISIT:  Medications  sodium chloride 0.9 % bolus 1,000 mL (0 mLs Intravenous Stopped 05/08/16 0511)  ondansetron (ZOFRAN) injection 4 mg (4 mg Intravenous Given 05/08/16 0130)  morphine 4 MG/ML injection 4 mg (4 mg Intravenous Given 05/08/16 0138)  HYDROmorphone (DILAUDID) injection 1 mg (1 mg Intravenous Given 05/08/16 0151)  iopamidol (ISOVUE-300) 61 %  injection 30 mL (30 mLs Oral Contrast Given 05/08/16 0259)  iopamidol (ISOVUE-300) 61 % injection 100 mL (100 mLs Intravenous Contrast Given 05/08/16 0534)     NEW OUTPATIENT MEDICATIONS STARTED DURING THIS VISIT:  Discharge Medication List as of 05/08/2016  9:40 AM    START taking these medications   Details  ondansetron (ZOFRAN ODT) 4 MG disintegrating tablet Take 1 tablet (4 mg total) by mouth every 8 (eight) hours as needed for nausea or vomiting., Starting Thu 05/08/2016, Print        Discharge Medication List as of 05/08/2016  9:40 AM      Discharge Medication List as of 05/08/2016  9:40 AM       Note:  This document was prepared using Dragon voice recognition software and may include unintentional dictation errors.    Darci Current, MD 05/09/16 [phone]

## 2016-05-08 NOTE — ED Triage Notes (Addendum)
Pt to triage via w/c, actively vomiting; pt reports mid abd pain for last several days accomp by N/V; st hx of same with pancreatitis; +ETOH use (last this afternoon)

## 2016-12-02 ENCOUNTER — Emergency Department
Admission: EM | Admit: 2016-12-02 | Discharge: 2016-12-11 | Disposition: E | Payer: Managed Care, Other (non HMO) | Attending: Emergency Medicine | Admitting: Emergency Medicine

## 2016-12-02 DIAGNOSIS — I469 Cardiac arrest, cause unspecified: Secondary | ICD-10-CM | POA: Insufficient documentation

## 2016-12-02 DIAGNOSIS — Z79899 Other long term (current) drug therapy: Secondary | ICD-10-CM | POA: Insufficient documentation

## 2016-12-02 DIAGNOSIS — J45909 Unspecified asthma, uncomplicated: Secondary | ICD-10-CM | POA: Insufficient documentation

## 2016-12-02 MED ORDER — NALOXONE HCL 4 MG/0.1ML NA LIQD
NASAL | Status: DC
Start: 2016-12-02 — End: 2016-12-02
  Filled 2016-12-02: qty 4

## 2016-12-02 MED ORDER — EPINEPHRINE PF 1 MG/ML IJ SOLN
1.0000 mg | Freq: Once | INTRAMUSCULAR | Status: AC
  Administered 2016-12-02: 1 mg via INTRAVENOUS

## 2016-12-02 MED ORDER — ATROPINE SULFATE 1 MG/ML IJ SOLN
1.0000 mg | Freq: Once | INTRAMUSCULAR | Status: AC
  Administered 2016-12-02: 1 mg via INTRAVENOUS
  Filled 2016-12-02: qty 1

## 2016-12-02 MED ORDER — NALOXONE HCL 2 MG/2ML IJ SOSY
PREFILLED_SYRINGE | INTRAMUSCULAR | Status: AC
  Administered 2016-12-02
  Filled 2016-12-02: qty 2

## 2016-12-11 NOTE — ED Notes (Addendum)
All clothes cut off of pt by ems and placed in plastic bag. Pt has on yellow/silver colored wedding band   Timex watch with army green band placed in belonging bag with clothes.

## 2016-12-11 NOTE — ED Notes (Signed)
Pt arrived via ems with cpr in progress.  Ems report giving epr, bicarb and narcan, calcium chloride at scene.  IO in place of right lower leg.  2 iv's started on arrival to er treatment room and pt intubated by dr brown with 7.5 ett tube.  Dr brown reports blood in throat and pt suctioned by RT.  meds given without response as cpr continued.  Pt remains in asystole.   cpr continued.  No response to narcan given.  Pt remained in asystole after meds.  Code called by dr brown at [id number]
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
## 2016-12-11 NOTE — ED Notes (Signed)
Voicemail left for family to call the ER  No family has arrived yet.  [place] aware.

## 2016-12-11 NOTE — ED Notes (Signed)
Spoke with daniele monroe of [employer]. Case number [id number].  Pt released from [facility] at this time.

## 2016-12-11 NOTE — Progress Notes (Signed)
Patient arrived via ems in cardiac arrest.  ACLS protocol followed. Patient was intubated by Dr Manson PasseyBrown. 7.5 et tube 25cm at lip. Positive color change noted per end tidal. Ambu bag in use per RT.
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
## 2016-12-11 NOTE — ED Provider Notes (Signed)
[facility] Emergency Department Provider Note   First MD Initiated Contact with Patient 12/01/16 2359     (approximate)  I have reviewed the triage vital signs and the nursing notes.  level V caveat: cardiac arrest HISTORY  Chief Complaint Cardiac Arrest    HPI Edgar Hatfield is a 46 y.o. male presents via EMSCPR in progress. Per EMS patient was found in his car at 11:30 PM with monitor revealing asystole and pulseless. EMS states that bystanders state that they pass the patient can't 8:30p.m. in the same location. EMS states that a powder like substance was noted at the scene that was checked by police officers at the scene and was confirmed to be fentanyl.   Past Medical History:  Diagnosis Date  . Asthma   . Pancreatitis     There are no active problems to display for this patient.   Past surgical history unknown  Prior to Admission medications   Medication Sig Start Date End Date Taking? Authorizing Provider  losartan (COZAAR) 25 MG tablet Take 25 mg by mouth daily.    [provider]  Omeprazole (PRILOSEC PO) Take 20 mg by mouth daily.     [provider]  ondansetron (ZOFRAN ODT) 4 MG disintegrating tablet Take 1 tablet (4 mg total) by mouth every 8 (eight) hours as needed for nausea or vomiting. 05/08/16   Eula Listen, MD  ondansetron (ZOFRAN) 4 MG tablet Take 1 tablet (4 mg total) by mouth daily as needed for nausea or vomiting. Patient not taking: Reported on 05/08/2016 10/19/15   Lavonia Drafts, MD  sertraline (ZOLOFT) 25 MG tablet Take 25 mg by mouth daily.    [provider]    Allergies unknown No family history on file.  Social History Social History  Substance Use Topics  . Smoking status: Never Smoker  . Smokeless tobacco: Never Used  . Alcohol use Yes    Review of Systems unable to obtain secondary to cardiac arrest  ____________________________________________   PHYSICAL  EXAM:  VITAL SIGNS: ED Triage Vitals  Enc Vitals Group     BP      Pulse      Resp      Temp      Temp src      SpO2      Weight      Height      Head Circumference      Peak Flow      Pain Score      Pain Loc      Pain Edu?      Excl. in [place]?     Constitutional: unresponsive Eyes: Pupils fixed and dilated bilaterally Head: Atraumatic. Mouth/Throat: blood(pooled) noted in the posterior oropharynx  Neck: no gross abnormality Cardiovascular: pulseless, asystole on areamonitor Respiratory: Normal respiratory effort.  No retractions. Lungs CTAB. Gastrointestinal: no gross abnormality nondistended Musculoskeletal: no evidence of trauma Neurologic:  unresponsive, Skin:  Skin is warm, dry and intact. No rash noted.   ____________________________________________   LABS (all labs ordered are listed, but only abnormal results are displayed)  Labs Reviewed - No data to display ____________________________________________  Cardiac Monitor patient remained in asystole during ED intervention  Critical Care: CRITICAL CARE Performed by: Gregor Hams   Total critical care time: 30 minutes  Critical care time was exclusive of separately billable procedures and treating other patients.  Critical care was necessary to treat or prevent imminent or life-threatening deterioration.  Critical care was  time spent personally by me on the following activities: development of treatment plan with patient and/or surrogate as well as nursing, discussions with consultants, evaluation of patient's response to treatment, examination of patient, obtaining history from patient or surrogate, ordering and performing treatments and interventions, ordering and review of laboratory studies, ordering and review of radiographic studies, pulse oximetry and re-evaluation of patient's condition.   Procedures   ____________________________________________   INITIAL IMPRESSION / ASSESSMENT AND PLAN  / ED COURSE  As part of my medical decision making, I reviewed the following data within the electronic MEDICAL RECORD [id number] year old male presenting with above stated history and physical exam. CPR continued in the emergency department as per ACLS protocol drugs were administered. Patient remained in asystole the entire ED intervention.Time of death 12:15 AM    ____________________________________________  FINAL CLINICAL IMPRESSION(S) / ED DIAGNOSES  Final diagnoses:  Cardiac arrest ([place])     MEDICATIONS GIVEN DURING THIS VISIT:  Medications  naloxone (NARCAN) 4 MG/0.1ML nasal spray kit (not administered)  naloxone (NARCAN) 2 MG/2ML injection (  Given 2016/12/22 0011)  EPINEPHrine (ADRENALIN) 1 mg (1 mg Intravenous Given 22-Dec-2016 0007)  EPINEPHrine (ADRENALIN) 1 mg (1 mg Intravenous Given 12-22-2016 0011)  EPINEPHrine (ADRENALIN) 1 mg (1 mg Intravenous Given 12-22-16 0013)  atropine injection 1 mg (1 mg Intravenous Given 22-Dec-2016 0008)     NEW OUTPATIENT MEDICATIONS STARTED DURING THIS VISIT:  New Prescriptions   No medications on file    Modified Medications   No medications on file    Discontinued Medications   No medications on file     Note:  This document was prepared using Dragon voice recognition software and may include unintentional dictation errors.    Gregor Hams, MD Dec 22, 2016 [phone]

## 2016-12-11 DEATH — deceased

## 2017-07-27 IMAGING — CT CT ABD-PELV W/ CM
2 of 5 series · 15 of 46 positions shown, 17 images · IV contrast (APPLIED)
Comparison: 03/07/2014 CT of the abdomen and pelvis

CLINICAL DATA: 45 y/o M; abdominal pain and vomiting. History of
pancreatitis.

EXAM:
CT ABDOMEN AND PELVIS WITH CONTRAST
TECHNIQUE: Multidetector CT imaging of the abdomen and pelvis was performed
using the standard protocol following bolus administration of
intravenous contrast.
CONTRAST:  100mL ZZJQTW-1BB IOPAMIDOL (ZZJQTW-1BB) INJECTION 61%

[Series 2: routine abd/pel with · axial · 0.69mm/px · z∈[-1018,-623]mm · 12 of 89 slices shown, 14 images]
[im 5/89  soft-tissue]
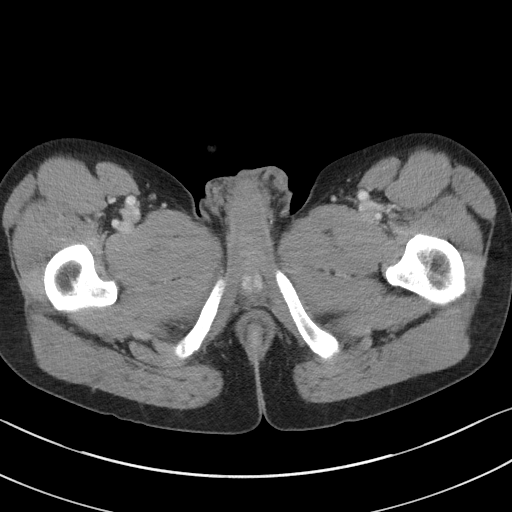
[im 5/89  bone]
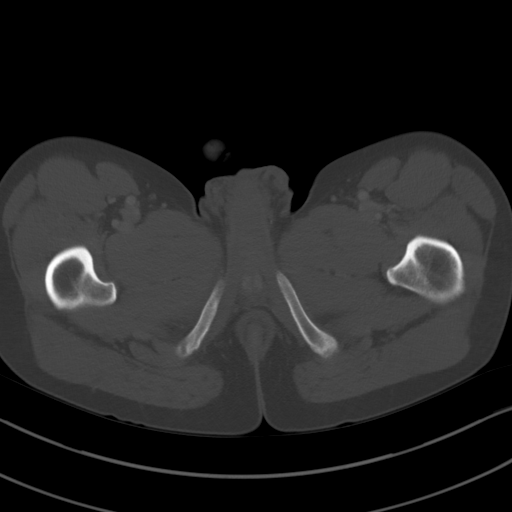
[im 14/89  soft-tissue]
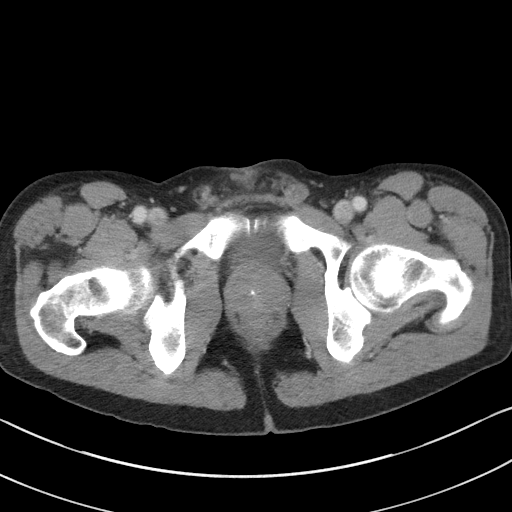
[im 18/89  soft-tissue]
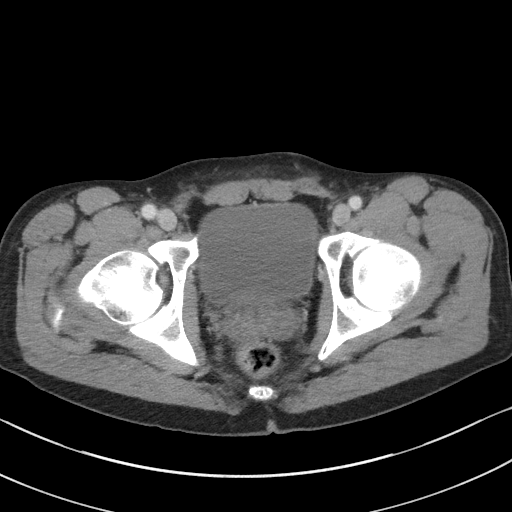
[im 27/89  soft-tissue]
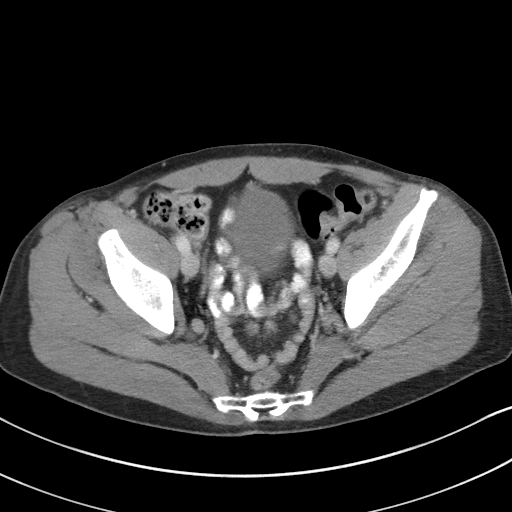
[im 36/89  soft-tissue]
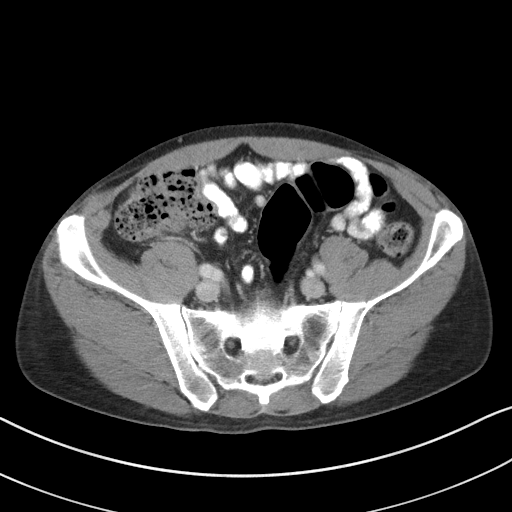
[im 40/89  soft-tissue]
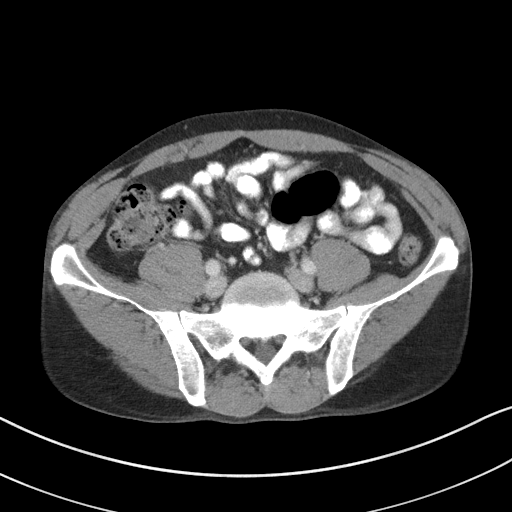
[im 49/89  soft-tissue]
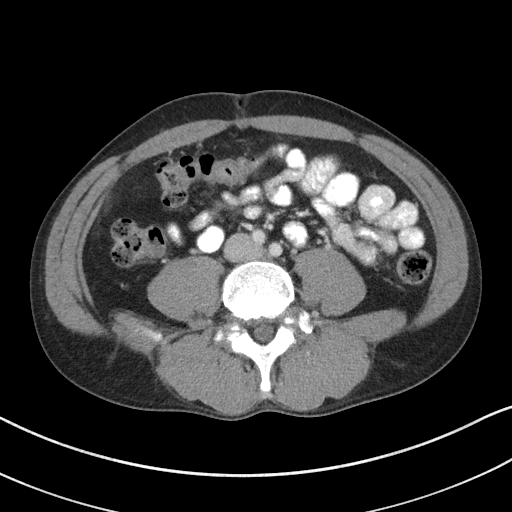
[im 53/89  soft-tissue]
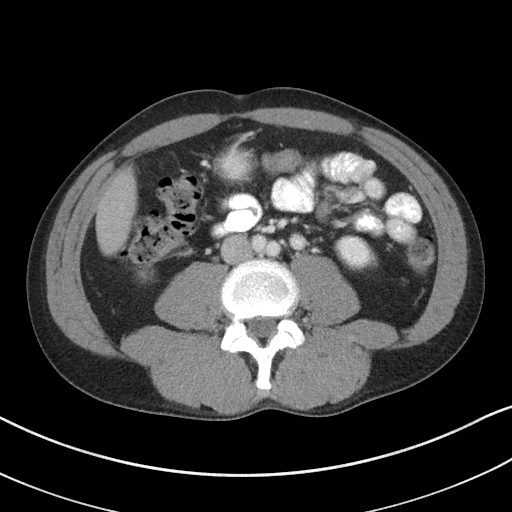
[im 62/89  soft-tissue]
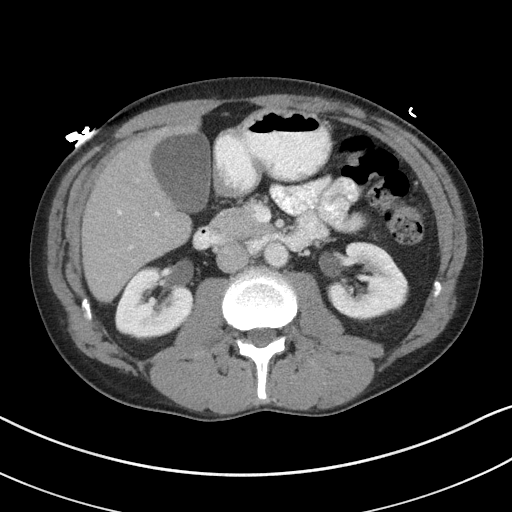
[im 62/89  bone]
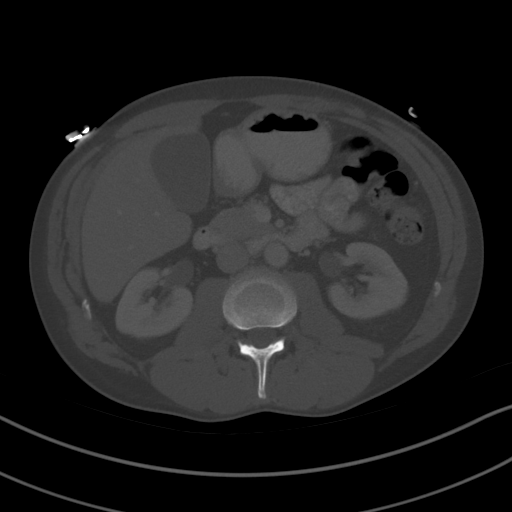
[im 71/89  soft-tissue]
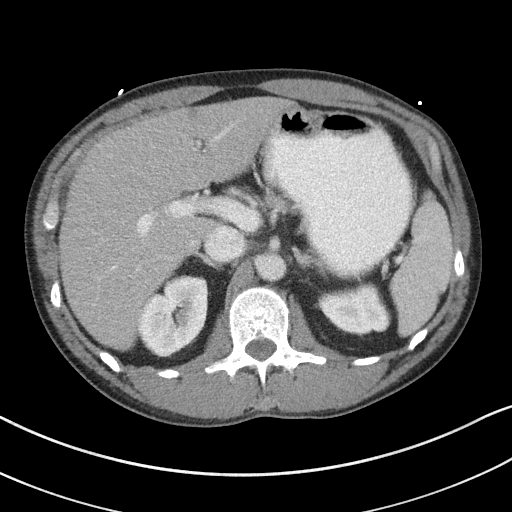
[im 75/89  soft-tissue]
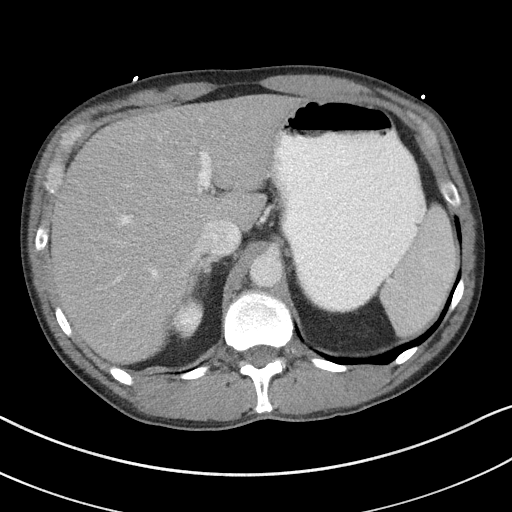
[im 84/89  soft-tissue]
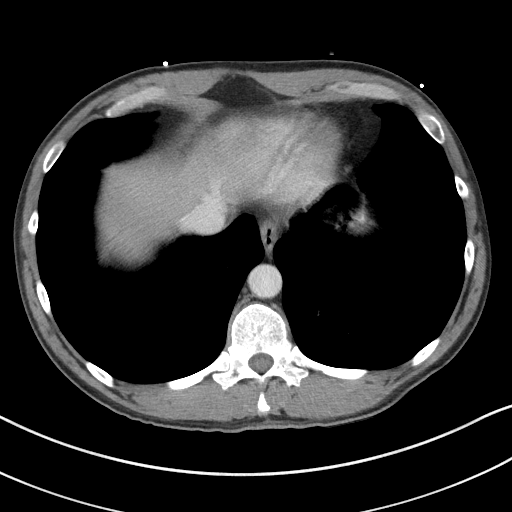

[Series 5: coronal st · coronal · 0.65mm/px · 3 of 86 slices shown]
[im 29/86  soft-tissue]
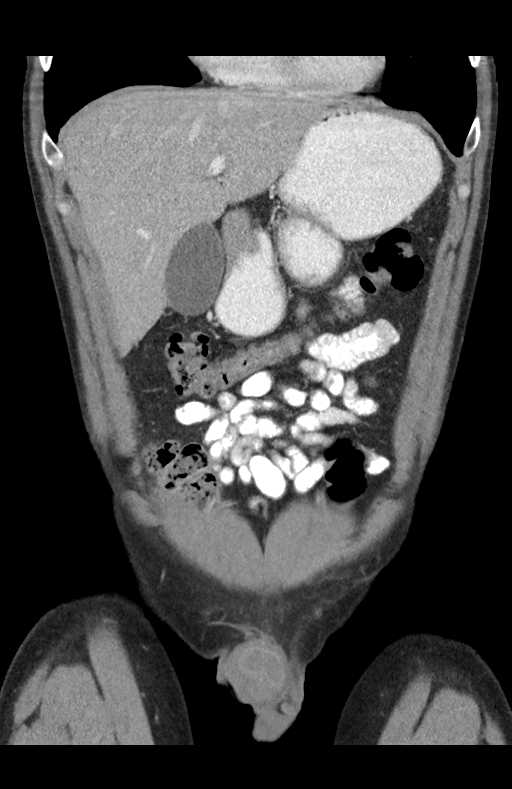
[im 38/86  soft-tissue]
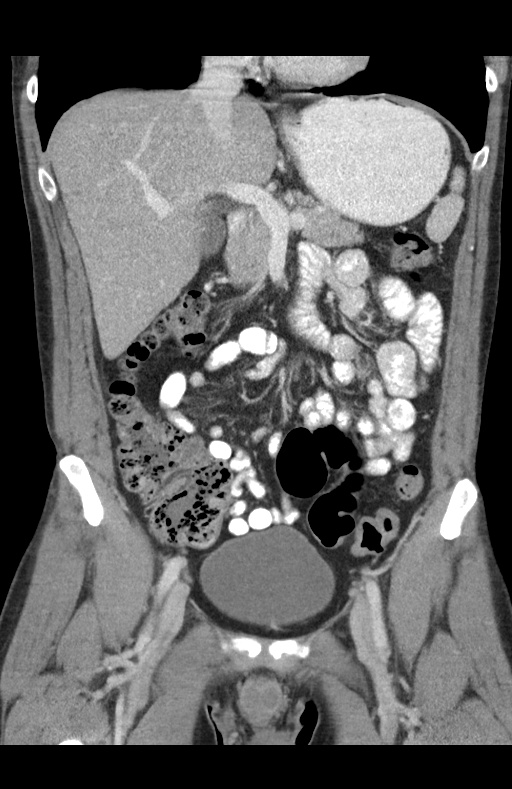
[im 48/86  soft-tissue]
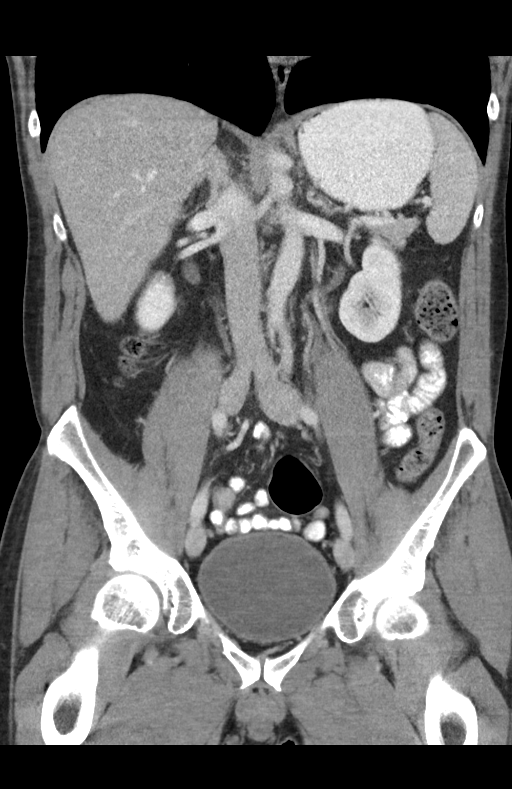

[15 of 46 positions shown; findings below may reference images not displayed]

FINDINGS: Lower chest: No acute abnormality.

Hepatobiliary: No focal liver abnormality is seen. No gallstones,
gallbladder wall thickening, or biliary dilatation.

Pancreas: Unremarkable. No pancreatic ductal dilatation or
surrounding inflammatory changes.

Spleen: Normal in size without focal abnormality.

Adrenals/Urinary Tract: Adrenal glands are unremarkable. Kidneys are
normal, without renal calculi, focal lesion, or hydronephrosis.
Bladder is unremarkable.

Stomach/Bowel: Stomach is within normal limits. Appendix appears
normal. No evidence of bowel wall thickening, distention, or
inflammatory changes.

Vascular/Lymphatic: No significant vascular findings are present. No
enlarged abdominal or pelvic lymph nodes.

Reproductive: Moderate prostate enlargement with central
calcifications.

Other: No abdominal wall hernia or abnormality. No abdominopelvic
ascites.

Musculoskeletal: No acute or significant osseous findings. Stable
grade 1 L4-5 anterolisthesis and chronic L4 pars defects.
IMPRESSION: 1. No acute process as explanation for abdominal pain identified.
2. Stable grade 1 L4-5 anterolisthesis with chronic L4 pars defects.
3. Otherwise unremarkable CT of abdomen and pelvis.

By: Baudelio Phipps M.D.
